# Patient Record
Sex: Female | Born: 2000 | Race: White | Hispanic: No | Marital: Single | Smoking: Never smoker
Health system: Southern US, Community
[De-identification: ages and names within clinical notes are randomized; demographics above are authoritative.]

---
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
## 2019-04-09 ENCOUNTER — Other Ambulatory Visit: Payer: Self-pay

## 2019-04-09 ENCOUNTER — Emergency Department (INDEPENDENT_AMBULATORY_CARE_PROVIDER_SITE_OTHER): Payer: 59

## 2019-04-09 ENCOUNTER — Emergency Department
Admission: EM | Admit: 2019-04-09 | Discharge: 2019-04-09 | Disposition: A | Discharge status: Home / Self Care | Payer: 59 | Source: Home / Self Care | Attending: Family Medicine | Admitting: Family Medicine

## 2019-04-09 DIAGNOSIS — M25571 Pain in right ankle and joints of right foot: Secondary | ICD-10-CM

## 2019-04-09 DIAGNOSIS — M7989 Other specified soft tissue disorders: Secondary | ICD-10-CM

## 2019-04-09 DIAGNOSIS — S93491A Sprain of other ligament of right ankle, initial encounter: Secondary | ICD-10-CM

## 2019-04-09 NOTE — ED Provider Notes (Signed)
[facility]    CSN: [id number] Arrival date & time: 04/09/19  1534     History   Chief Complaint Chief Complaint  Patient presents with  . Foot Injury    ankle    HPI Tammy Schaefer is a 18 y.o. female.   Patient twisted her right ankle about two hours ago while walking down stairs at home.  She has had persistent pain/swelling in her ankle.  The history is provided by the patient.  Ankle Pain Location:  Ankle Time since incident:  2 hours Injury: yes   Mechanism of injury: fall   Fall:    Fall occurred:  Down stairs Ankle location:  R ankle Chronicity:  New Dislocation: no   Foreign body present:  No foreign bodies Prior injury to area:  No Relieved by:  Nothing Worsened by:  Bearing weight Ineffective treatments:  Acetaminophen Associated symptoms: decreased ROM, stiffness and swelling   Associated symptoms: no muscle weakness, no numbness and no tingling     History reviewed. No pertinent past medical history.  There are no active problems to display for this patient.   History reviewed. No pertinent surgical history.  OB History   No obstetric history on file.      Home Medications    Prior to Admission medications   Medication Sig Start Date End Date Taking? Authorizing Provider  albuterol (PROVENTIL HFA;VENTOLIN HFA) 108 (90 Base) MCG/ACT inhaler Inhale 1-2 puffs into the lungs every 6 (six) hours as needed for wheezing or shortness of breath. 01/26/16   Lurene ShadowPhelps, Erin O, PA-C    Family History History reviewed. No pertinent family history.  Social History Social History   Tobacco Use  . Smoking status: Never Smoker  Substance Use Topics  . Alcohol use: No  . Drug use: Not on file     Allergies   Patient has no known allergies.   Review of Systems Review of Systems  Musculoskeletal: Positive for stiffness.  All other systems reviewed and are negative.    Physical Exam Triage Vital Signs ED Triage Vitals  Enc Vitals  Group     BP --      Pulse --      Resp --      Temp --      Temp src --      SpO2 --      Weight 04/09/19 1553 160 lb (72.6 kg)     Height 04/09/19 1553 5\' 5"  (1.651 m)     Head Circumference --      Peak Flow --      Pain Score 04/09/19 1552 5     Pain Loc --      Pain Edu? --      Excl. in GC? --    No data found.  Updated Vital Signs Ht 5\' 5"  (1.651 m)   Wt 72.6 kg   LMP 03/14/2019 (Approximate)   BMI 26.63 kg/m   Visual Acuity Right Eye Distance:   Left Eye Distance:   Bilateral Distance:    Right Eye Near:   Left Eye Near:    Bilateral Near:     Physical Exam Vitals signs and nursing note reviewed.  Constitutional:      General: She is not in acute distress. HENT:     Head: Atraumatic.     Nose: Nose normal.     Mouth/Throat:     Pharynx: Oropharynx is clear.  Eyes:     Pupils: Pupils are  equal, round, and reactive to light.  Neck:     Musculoskeletal: Normal range of motion.  Cardiovascular:     Rate and Rhythm: Regular rhythm. Tachycardia present.  Pulmonary:     Effort: Pulmonary effort is normal.  Musculoskeletal:        General: Swelling present. No deformity.       Feet:     Comments: Right ankle:  Decreased range of motion.  Tenderness and swelling over the lateral malleolus.  Joint stable.  No tenderness over the base of the fifth metatarsal.  Distal neurovascular function is intact.   Skin:    General: Skin is warm and dry.  Neurological:     Mental Status: She is alert.      UC Treatments / Results  Labs (all labs ordered are listed, but only abnormal results are displayed) Labs Reviewed - No data to display  EKG None  Radiology Dg Ankle Complete Right  Result Date: 04/09/2019 CLINICAL DATA:  Fall down steps today now with pain about the lateral aspect of the ankle. EXAM: RIGHT ANKLE - COMPLETE 3+ VIEW COMPARISON:  None. FINDINGS: Soft tissue swelling about the lateral malleolus. This finding is without associated fracture or  dislocation. Suspected small ankle joint effusion. Joint spaces are preserved. Ankle mortise is preserved. IMPRESSION: Soft tissue swelling about the lateral malleolus and suspected small ankle joint effusion but without associated fracture or dislocation. Electronically Signed   By: Sandi Mariscal M.D.   On: 04/09/2019 16:21    Procedures Procedures (including critical care time)  Medications Ordered in UC Medications - No data to display  Initial Impression / Assessment and Plan / UC Course  I have reviewed the triage vital signs and the nursing notes.  Pertinent labs & imaging results that were available during my care of the patient were reviewed by me and considered in my medical decision making (see chart for details).    Ace wrap applied.  Dispensed AirCast stirrup splint.  Dispensed crutches. Followup with Dr. Aundria Mems or Dr. Lynne Leader ([facility]) if not improving about two weeks.    Final Clinical Impressions(s) / UC Diagnoses   Final diagnoses:  Sprain of anterior talofibular ligament of right ankle, initial encounter     Discharge Instructions     Apply ice pack for 30 minutes every 1 to 2 hours today and tomorrow.  Elevate.  Use crutches for 4 to 7 days.  Wear Ace wrap until swelling decreases.  Wear brace for about 2 to 3 weeks.  Begin range of motion and stretching exercises in about 5 days as per instruction sheet.  May take Ibuprofen 200mg , 3 or 4 tabs every 8 hours with food.  May take Tylenol, if needed, for pain.     ED Prescriptions    None        Kandra Nicolas, MD 04/16/19 1104

## 2019-04-09 NOTE — ED Triage Notes (Signed)
Pt c/o RT ankle pain since twisting her ankle walking down stairs at her home. Ice applied. Took tylenol before coming here.

## 2019-04-09 NOTE — Discharge Instructions (Signed)
Apply ice pack for 30 minutes every 1 to 2 hours today and tomorrow.  Elevate.  Use crutches for 4 to 7 days.  Wear Ace wrap until swelling decreases.  Wear brace for about 2 to 3 weeks.  Begin range of motion and stretching exercises in about 5 days as per instruction sheet.  May take Ibuprofen 200mg , 3 or 4 tabs every 8 hours with food.  May take Tylenol, if needed, for pain.

## 2019-04-11 ENCOUNTER — Telehealth: Payer: Self-pay

## 2019-04-11 NOTE — Telephone Encounter (Signed)
Left voice message inquiring about patients status. Encouraged patient to call with questions or concerns.  

## 2020-06-13 IMAGING — DX RIGHT ANKLE - COMPLETE 3+ VIEW
3 series · 3 of 3 positions shown · non-contrast
Comparison: None.

CLINICAL DATA: Fall down steps today now with pain about the
lateral aspect of the ankle.

EXAM:
RIGHT ANKLE - COMPLETE 3+ VIEW

[ankle ap]
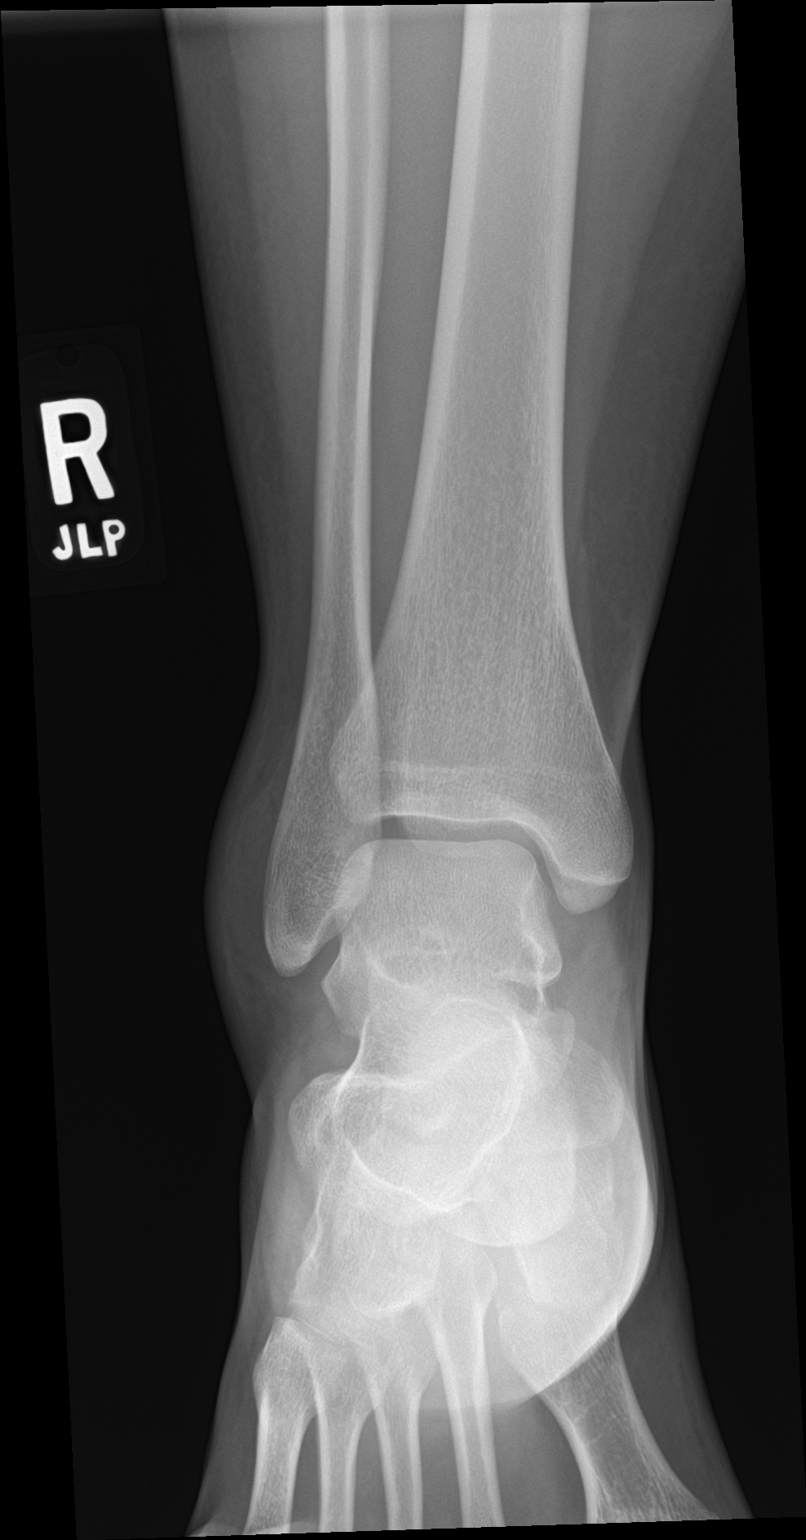

[ankle obl]
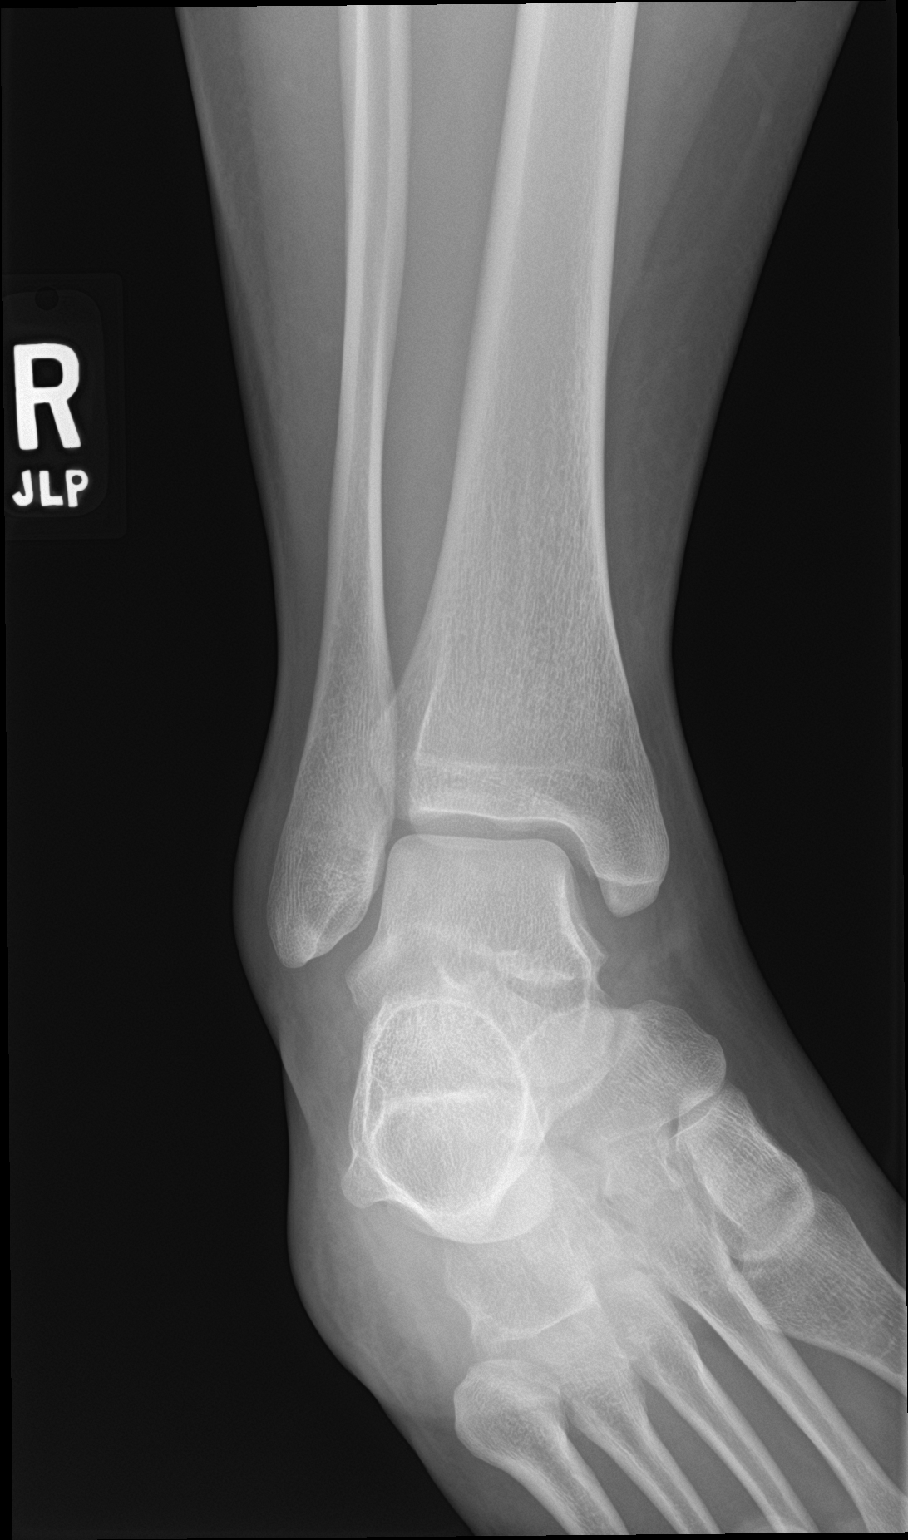

[ankle lat]
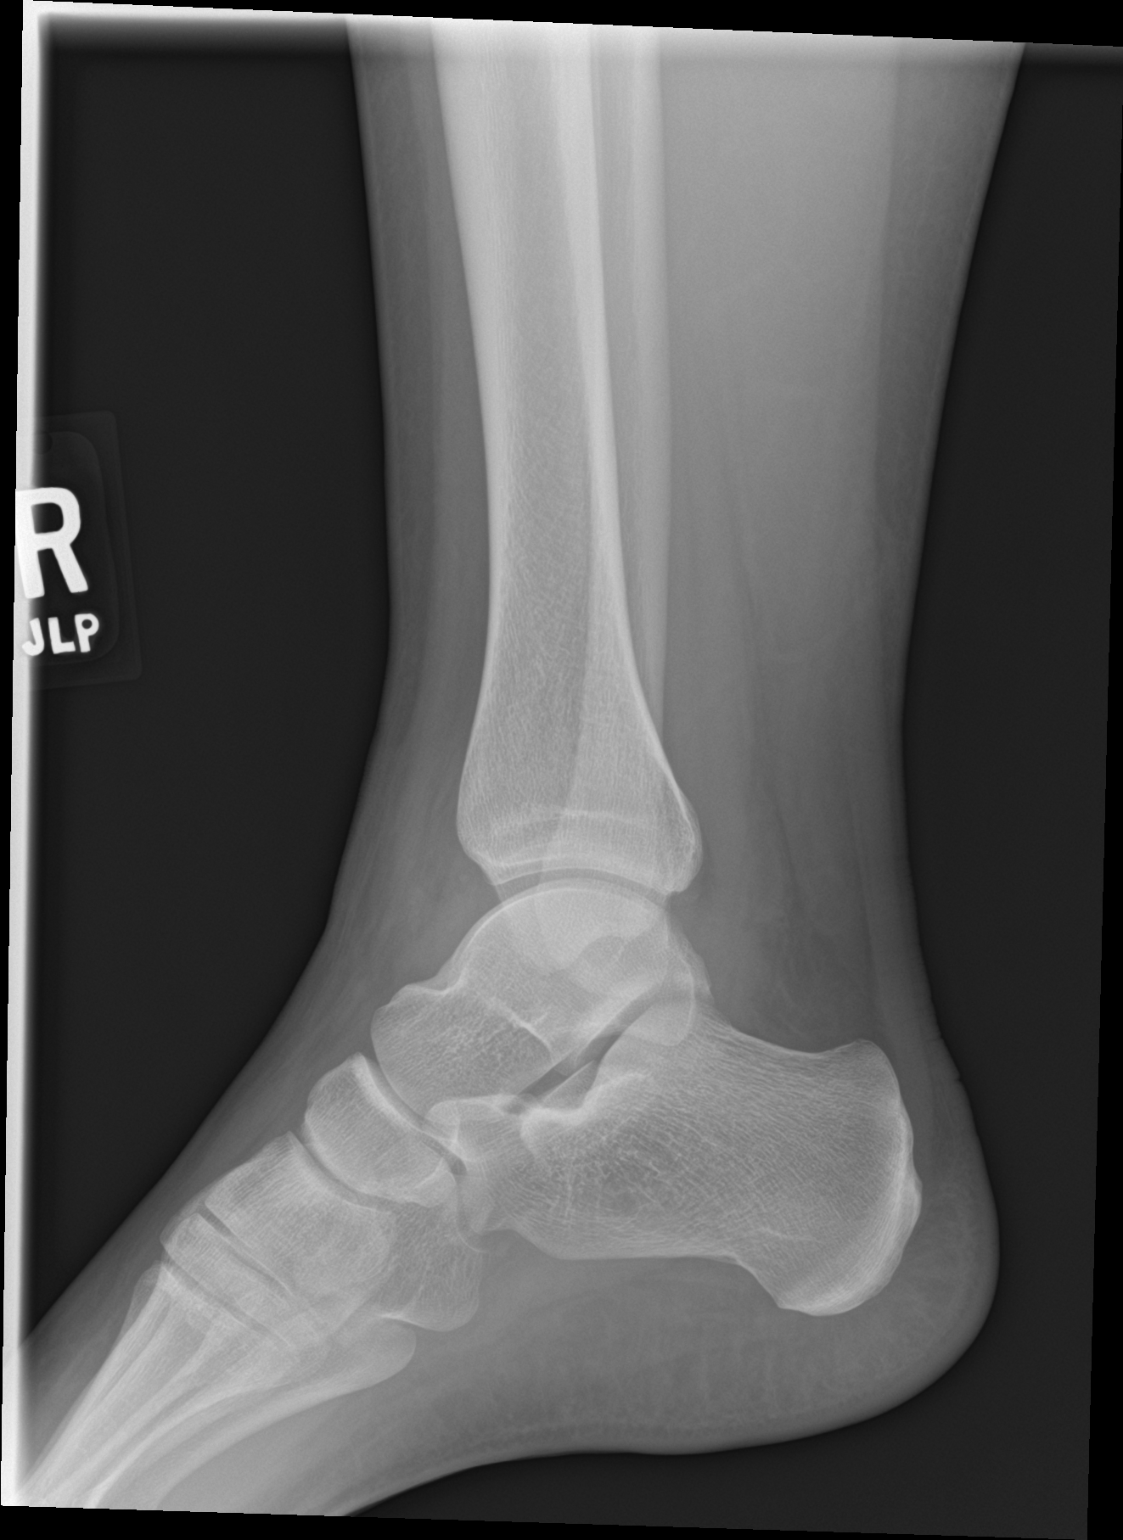

[3 of 3 positions shown; findings below may reference images not displayed]

FINDINGS: Soft tissue swelling about the lateral malleolus. This finding is
without associated fracture or dislocation. Suspected small ankle
joint effusion. Joint spaces are preserved. Ankle mortise is
preserved.
IMPRESSION: Soft tissue swelling about the lateral malleolus and suspected small
ankle joint effusion but without associated fracture or dislocation.
